# Patient Record
Sex: Male | Born: 1950 | Race: Black or African American | Hispanic: No | Marital: Married | Smoking: Current every day smoker
Health system: Southern US, Community
[De-identification: ages and names within clinical notes are randomized; demographics above are authoritative.]

## PROBLEM LIST (undated history)

## (undated) DIAGNOSIS — M549 Dorsalgia, unspecified: Secondary | ICD-10-CM

## (undated) DIAGNOSIS — G8929 Other chronic pain: Secondary | ICD-10-CM

## (undated) DIAGNOSIS — C801 Malignant (primary) neoplasm, unspecified: Secondary | ICD-10-CM

## (undated) DIAGNOSIS — G510 Bell's palsy: Secondary | ICD-10-CM

## (undated) DIAGNOSIS — J329 Chronic sinusitis, unspecified: Secondary | ICD-10-CM

## (undated) DIAGNOSIS — B029 Zoster without complications: Secondary | ICD-10-CM

## (undated) HISTORY — PX: PROSTATE SURGERY: SHX751

---

## 2007-02-14 ENCOUNTER — Encounter: Admission: RE | Admit: 2007-02-14 | Discharge: 2007-02-14 | Payer: Self-pay | Admitting: Urology

## 2007-05-04 ENCOUNTER — Inpatient Hospital Stay (HOSPITAL_COMMUNITY): Admission: RE | Admit: 2007-05-04 | Discharge: 2007-05-06 | Payer: Self-pay | Admitting: Urology

## 2007-05-04 ENCOUNTER — Encounter (INDEPENDENT_AMBULATORY_CARE_PROVIDER_SITE_OTHER): Payer: Self-pay | Admitting: Urology

## 2007-07-29 ENCOUNTER — Ambulatory Visit (HOSPITAL_BASED_OUTPATIENT_CLINIC_OR_DEPARTMENT_OTHER): Admission: RE | Admit: 2007-07-29 | Discharge: 2007-07-29 | Payer: Self-pay | Admitting: Urology

## 2010-09-30 NOTE — Op Note (Signed)
Gene Todd, Gene Todd               ACCOUNT NO.:  [id number]   MEDICAL RECORD NO.:  [id number]          PATIENT TYPE:  AMB   LOCATION:  [facility]                         FACILITY:  [facility]   PHYSICIAN:  Heloise Purpura, MD      DATE OF BIRTH:  [birth year]   DATE OF PROCEDURE:  07/29/2007  DATE OF DISCHARGE:                               OPERATIVE REPORT   PREOPERATIVE DIAGNOSIS:  Bladder stone versus foreign body.   POSTOPERATIVE DIAGNOSIS:  Bladder stone.   PROCEDURE:  1. Cystoscopy.  2. Removal of bladder stone (0.5 cm).  3. Fulguration of bladder.   SURGEON:  Heloise Purpura, MD   ANESTHESIA:  General.   COMPLICATIONS:  None.   INDICATION:  Gene Todd is a 60 year old gentleman with a history of  prostate cancer status post a robotic prostatectomy approximately 2-1/2  month ago.  He has had persistent dysuria with pyuria and hematuria  following his prostatectomy which has not improved.  He has undergone  multiple urine cultures which have all been negative, and he has not  improved despite empiric treatment with antibiotics, anti-  inflammatories, and pelvic physiotherapy.  He underwent office  cystoscopy which he did not tolerate very well, but did allow me to see  that he appeared to have calcification around the area of the bladder  neck consistent with stone material.  No definite foreign body was seen,  but it was suspected that he could potentially have a foreign body at  his anastomosis.  We discussed options for management.  He elected to  proceed with the above procedures.  Potential risks, complications, and  alternative options were discussed; and informed consent was obtained.   DESCRIPTION OF PROCEDURE:  The patient was taken to the operating room,  and a general anesthetic was administered.  He was given preoperative  antibiotics, placed in the dorsal lithotomy position, and prepped and  draped in the usual sterile fashion.   Next a preoperative time-out was  performed.  Cystourethroscopy was  performed with both a 12-and-70-degree lens which demonstrated the  ureteral orifices to be in the normal anatomic position and effluxing  clear urine.  There was no evidence of any bladder tumors, or mucosal  abnormalities within the bladder.  There was noted to be stone material  along the posterior aspect of the bladder-urethral anastomosis.  Flexible graspers were used to remove this stone material, and there was  no obvious underlying foreign body identified.  There was no stone  material located anteriorly where any stable migration would be expected  to be located.   On the right posterior aspect of the anastomosis, there did appear to be  a small pocket beneath the mucosa.  This was probed with a 6-French  ureteral catheter which appeared to demonstrate a blind-ending, and not  very deep pouch.  This is where the majority of the stone material was  located, and it was all removed.  There was noted to be a little  bit of bleeding near the bladder neck which was fulgurated until  hemostasis was achieved.  The patient's bladder was  emptied, and the  procedure was ended.  He appeared to tolerate the procedure well, and  without complications.  He was able to be awakened, and transferred to  the recovery unit in satisfactory condition.      Heloise Purpura, MD  Electronically Signed     LB/MEDQ  D:  07/29/2007  T:  07/30/2007  Job:  [id number]

## 2010-09-30 NOTE — Op Note (Signed)
Gene Todd, Gene Todd               ACCOUNT NO.:  [id number]   MEDICAL RECORD NO.:  [id number]          PATIENT TYPE:  INP   LOCATION:  0009                         FACILITY:  [facility]   PHYSICIAN:  Heloise Purpura, MD      DATE OF BIRTH:  22-May-1950   DATE OF PROCEDURE:  05/04/2007  DATE OF DISCHARGE:                               OPERATIVE REPORT   PREOPERATIVE DIAGNOSIS:  Clinically localized adenocarcinoma of  prostate.   POSTOPERATIVE DIAGNOSIS:  Clinically localized adenocarcinoma of  prostate.   PROCEDURES.:  1. Robotic assisted laparoscopic radical prostatectomy (non nerve      sparing).  2. Bilateral laparoscopic pelvic lymphadenectomy.   SURGEON:  Heloise Purpura, MD   ASSISTANT:  Dr. Rachel Bo   ANESTHESIA:  General.   COMPLICATIONS:  None.   ESTIMATED BLOOD LOSS:  100 mL.   SPECIMENS:  1. Prostate seminal vesicles.  2. Right pelvic lymph nodes.  3. Left pelvic lymph nodes.   DISPOSITION OF SPECIMENS:  To pathology.   DRAINS:  1. 20-French coude' catheter.  2. #19 Blake pelvic drain.   INDICATIONS:  Gene Todd is a 60 year old gentleman with clinically  localized adenocarcinoma of prostate.  After undergoing a thorough  preoperative discussion regarding management options for treatment of  his clinically localized prostate cancer, he elected to proceed with  surgical therapy and the above procedures.  Potential risks,  complications and alternative options were discussed in detail with the  patient and informed consent was obtained.   DESCRIPTION OF PROCEDURE:  The patient was taken to the operating room  and a general anesthetic was administered.  He was given preoperative  antibiotics, placed in the dorsal lithotomy position, and prepped and  draped in the usual sterile fashion.  Next a preoperative time-out was  performed.  A Foley catheter was inserted into the bladder.  A site was  then selected just superior to the umbilicus for placement of the camera  port.  This was placed using a standard open Hasson technique.  This  allowed entry into the peritoneal cavity under direct vision without  difficulty.  A 12 mm port was then placed and a pneumoperitoneum  established.  The 0 degrees lens was used to inspect the abdomen and  there was no evidence of any intra-abdominal injuries or other  abnormalities.  The remaining ports then placed.  Bilateral 8 mm robotic  ports were placed approximately 10 cm lateral to and just inferior to  the camera port site.  Additional 8 mm robotic port was placed in the  far left lateral abdominal wall.  A 5 mm port was placed between the  camera port and the right robotic port.  An additional 12 mm port was  placed in the far right lateral abdominal wall for laparoscopic  assistance.  All ports placed under direct vision without difficulty.  The surgical cart was then docked.  With the aid of the cautery  scissors, the bladder was reflected posteriorly allowing entry in the  space of Retzius and identification of the endopelvic fascia and  prostate.  The endopelvic fascia  was then incised from the apex back to  the base of the prostate bilaterally and the underlying levator muscle  fibers were swept laterally off the prostate.  The dorsal venous complex  was therefore isolated and was then stapled and divided with a 45 mm  flex ETS stapler.  The bladder neck was then identified with the aid of  full of Foley catheter manipulation and divided anteriorly.  This  exposed the Foley catheter and the catheter balloon was deflated.  The  catheter was then brought into the operative field and used to retract  the prostate anteriorly which exposed the posterior bladder neck.  The  bladder was then divided posteriorly and dissection continued between  the bladder and prostate until the vasa deferentia and seminal vesicles  were identified.  The vasa deferentia were isolated, divided and lifted  anteriorly.  The seminal  vesicles were then dissected down to their tips  with care to control the seminal vesicle arterial blood supply.  Seminal  vesicles were then lifted anteriorly and the space between Denonvilliers  fascia and the anterior rectum was bluntly developed thereby isolating  the vascular pedicles of the prostate.  The vascular pedicles of  prostate then ligated with Hem-o-lok clips in a wide non nerve sparing  fashion.  The urethra was then sharply divided allowing the prostate  specimen to be disarticulated.  The pelvis was then copiously irrigated  and with irrigation in the pelvis, air was injected into the rectal  catheter and there was no evidence of a rectal injury.  Attention then  turned to the right pelvic sidewall and the fibrofatty tissue between  the external iliac vein, confluence of the iliac vessels, hypogastric  artery, and Cooper's ligament was dissected free from the pelvic  sidewall with care to preserve the obturator nerve.  Hem-o-lok clips  were used for lymphostasis and hemostasis.  This specimen was then  passed off for permanent pathologic analysis.  An identical procedure  was then performed on the contralateral side.  Attention then turned the  urethral anastomosis.  A 2-0 Vicryl suture was placed as a slip knot  between the posterior urethra, Denonvilliers fascia and the posterior  bladder neck to reapproximate these structures.  A double-armed 3-0  Monocryl suture was then used perform a 360 degrees running tension-free  anastomosis between the bladder neck and urethra.  A new 20-French  coude' catheter was inserted into the bladder and irrigated.  There no  blood clots within the bladder and anastomosis appeared be watertight.  A #19 Blake drain was then brought through the left robotic port and  appropriately position in the pelvis.  It was secured to skin with nylon  suture.  The surgical cart was then undocked and the right lateral 12 mm  port site was closed with  a 0 Vicryl suture placed with the aid of  suture passer device.  All remaining ports removed under direct vision  and the prostate specimen was removed intact via the periumbilical port  site.  This fascial opening was then closed with a running 0 Vicryl  suture.  All port sites were injected with 0.25% Marcaine and  reapproximated skin level with staples.  Sterile dressings were applied.  The patient appeared tolerate procedure well without complications.  He  was able to be extubated and transferred to recovery unit in  satisfactory condition.      Heloise Purpura, MD  Electronically Signed     LB/MEDQ  D:  05/04/2007  T:  05/05/2007  Job:  [id number]

## 2010-09-30 NOTE — Discharge Summary (Signed)
NAMECARTER, Gene Todd               ACCOUNT NO.:  [id number]   MEDICAL RECORD NO.:  [id number]          PATIENT TYPE:  INP   LOCATION:  1420                         FACILITY:  [facility]   PHYSICIAN:  Heloise Purpura, MD      DATE OF BIRTH:  [birth year]   DATE OF ADMISSION:  05/04/2007  DATE OF DISCHARGE:  05/06/2007                               DISCHARGE SUMMARY   ADMISSION DIAGNOSIS:  Prostate cancer.   DISCHARGE DIAGNOSIS:  Prostate cancer.   HISTORY AND PHYSICAL:  For full details please see admission history and  physical.   Briefly, the patient is a 60 year old gentleman with clinically  localized adenocarcinoma of the prostate.  After discussing management  options for treatment, he elected to proceed with surgical therapy and a  robotic radical prostatectomy.   HOSPITAL COURSE:  On May 04, 2007 the patient was taken to the  operating room and underwent a robotic assisted laparoscopic radical  prostatectomy and bilateral pelvic lymphadenectomy.  He tolerated this  procedure well without complications.  Postoperatively he was able to be  transferred to a regular hospital room following recovery from  anesthesia.  He was able to begin ambulating the night of surgery.   On postoperative day #1 he was found to be hemodynamically stable and  his hematocrit remained stable at 40.0.  He maintained excellent urine  output with minimal output from his pelvic drain and his pelvic drain  was removed.  He continued ambulating was begun on a clear liquid diet.  On the afternoon of postoperative day #1 he did have significant  abdominal pain and distention.  This was managed with suppositories and  the patient subsequently passed flatus that evening.  By the morning of  postoperative day #2 he was much improved and was in excellent condition  for discharge home.   DISPOSITION:  Home.   DISCHARGE MEDICATIONS:  The patient was instructed to resume his regular  home medications, except  for any aspirin, nonsteroidal anti-inflammatory  drugs or herbal supplements.  He was given a prescription to take  Vicodin as needed for pain and to use Colace as stool softener.  He was  also given a prescription to begin Cipro 1 day prior to his return visit  for Foley catheter removal.   DISCHARGE INSTRUCTIONS:  The patient was instructed to be ambulatory,  but specifically told to refrain from any heavy lifting, strenuous  activity or driving.  He was given instructions on routine Foley  catheter care and told to gradually advance his diet over the course of  the next few days.   FOLLOW UP:  He will follow up in 1 week for removal of his Foley  catheter and to discuss his surgical pathology in detail.      Heloise Purpura, MD  Electronically Signed     LB/MEDQ  D:  05/06/2007  T:  05/08/2007  Job:  [id number]

## 2010-09-30 NOTE — H&P (Signed)
NAMEHERMENEGILDO, Gene Todd               ACCOUNT NO.:  [id number]   MEDICAL RECORD NO.:  [id number]          PATIENT TYPE:  INP   LOCATION:  NA                           [facility]ACILITY:  [facility]   PHYSICIAN:  Heloise Purpura, MD      DATE OF BIRTH:  [birth year]   DATE OF ADMISSION:  DATE OF DISCHARGE:                              HISTORY & PHYSICAL   CHIEF COMPLAINT:  Prostate cancer.   HISTORY:  Gene Todd is a 60 year old gentleman with clinical stage T1C  prostate cancer with a PSA of 15 and Gleason score 3 +4 equals 7.  Staging studies were negative for metastatic disease.  After discussing  options for treatment management, he elected to proceed with surgical  therapy and a robotic prostatectomy.   PAST MEDICAL HISTORY:  1. Gastroesophageal reflux disease.  2. Hyperlipidemia.   PAST SURGICAL HISTORY:  None.   MEDICATIONS:  None.   ALLERGIES:  No known drug allergies.   FAMILY HISTORY:  There is no family history of prostate cancer.   SOCIAL HISTORY:  The patient denies alcohol or current tobacco use.   REVIEW OF SYSTEMS:  A complete Review of Systems was performed.  Pertinent positives include a recent history of heartburn, tea,  pruritus, sinus problems, anxiety and depression.  All other systems are  reviewed and are otherwise negative.   PHYSICAL EXAMINATION:  VITAL SIGNS:  Afebrile with stable vital signs.  CONSTITUTIONAL:  Alert and oriented, in no acute distress.  CARDIOVASCULAR:  Regular rate and rhythm without obvious murmurs.  LUNGS:  Clear bilaterally.  ABDOMEN:  Soft, nontender, nondistended without abdominal masses.  RECTAL:  Prostate does not demonstrate any obvious nodularity or  induration.   IMPRESSION:  Clinically localized adenocarcinoma of the prostate.   PLAN:  Gene Todd will undergo a robotic-assisted laparoscopic radical  prostatectomy and bilateral pelvic lymphadenectomy.  He will then be  admitted to the hospital for routine postoperative care.      Heloise Purpura, MD  Electronically Signed     LB/MEDQ  D:  05/04/2007  T:  05/04/2007  Job:  [id number]

## 2011-02-09 LAB — POCT HEMOGLOBIN-HEMACUE
Hemoglobin: 16.2
Operator id: 133231

## 2011-02-20 LAB — HEMOGLOBIN AND HEMATOCRIT, BLOOD
Hemoglobin: 14
Hemoglobin: 14.2

## 2011-02-20 LAB — CBC
Hemoglobin: 15.6
MCV: 88.1
Platelets: 188
RBC: 5.13
RDW: 13.9

## 2011-02-20 LAB — TYPE AND SCREEN
ABO/RH(D): B POS
Antibody Screen: NEGATIVE

## 2011-02-20 LAB — BASIC METABOLIC PANEL
BUN: 13
CO2: 28
GFR calc Af Amer: >60
GFR calc non Af Amer: >60
Potassium: 4

## 2013-06-02 ENCOUNTER — Emergency Department (HOSPITAL_COMMUNITY)
Admission: EM | Admit: 2013-06-02 | Discharge: 2013-06-02 | Disposition: A | Discharge status: Home / Self Care | Payer: Self-pay | Attending: Emergency Medicine | Admitting: Emergency Medicine

## 2013-06-02 ENCOUNTER — Encounter (HOSPITAL_COMMUNITY): Payer: Self-pay | Admitting: Emergency Medicine

## 2013-06-02 DIAGNOSIS — M5430 Sciatica, unspecified side: Secondary | ICD-10-CM | POA: Insufficient documentation

## 2013-06-02 DIAGNOSIS — Z8669 Personal history of other diseases of the nervous system and sense organs: Secondary | ICD-10-CM | POA: Insufficient documentation

## 2013-06-02 DIAGNOSIS — M5432 Sciatica, left side: Secondary | ICD-10-CM

## 2013-06-02 DIAGNOSIS — Z8546 Personal history of malignant neoplasm of prostate: Secondary | ICD-10-CM | POA: Insufficient documentation

## 2013-06-02 DIAGNOSIS — Z8709 Personal history of other diseases of the respiratory system: Secondary | ICD-10-CM | POA: Insufficient documentation

## 2013-06-02 DIAGNOSIS — Z8619 Personal history of other infectious and parasitic diseases: Secondary | ICD-10-CM | POA: Insufficient documentation

## 2013-06-02 DIAGNOSIS — F17200 Nicotine dependence, unspecified, uncomplicated: Secondary | ICD-10-CM | POA: Insufficient documentation

## 2013-06-02 DIAGNOSIS — G8929 Other chronic pain: Secondary | ICD-10-CM | POA: Insufficient documentation

## 2013-06-02 HISTORY — DX: Zoster without complications: B02.9

## 2013-06-02 HISTORY — DX: Other chronic pain: G89.29

## 2013-06-02 HISTORY — DX: Malignant (primary) neoplasm, unspecified: C80.1

## 2013-06-02 HISTORY — DX: Dorsalgia, unspecified: M54.9

## 2013-06-02 HISTORY — DX: Bell's palsy: G51.0

## 2013-06-02 HISTORY — DX: Chronic sinusitis, unspecified: J32.9

## 2013-06-02 MED ORDER — CYCLOBENZAPRINE HCL 10 MG PO TABS: 10.0000 mg | ORAL_TABLET | Freq: Three times a day (TID) | ORAL | Status: AC | PRN

## 2013-06-02 MED ORDER — MELOXICAM 7.5 MG PO TABS: 7.5000 mg | ORAL_TABLET | Freq: Every day | ORAL | Status: AC

## 2013-06-02 NOTE — ED Notes (Signed)
Pain lt hip for 5 years intermittently and worse last 3 weeks. Pain from lt hip to knee with burning pain down lower leg.

## 2013-06-02 NOTE — Discharge Instructions (Signed)
It is important that you try and get established with a primary care doctor to follow up on your health issues.

## 2013-06-02 NOTE — ED Provider Notes (Signed)
CSN: [id number]     Arrival date & time 06/02/13  1314 History   First MD Initiated Contact with Patient 06/02/13 1408     Chief Complaint  Patient presents with  . Hip Pain   (Consider location/radiation/quality/duration/timing/severity/associated sxs/prior Treatment) Patient is a 63 y.o. male presenting with hip pain. The history is provided by the patient.  Hip Pain This is a chronic problem. The current episode started more than 1 year ago. The problem occurs constantly. The problem has been gradually worsening.   Gene Todd is a 63 y.o. male who presents to the ED with chronic left hip pain that has been going on for over a year. He does not have a PCP due to lack of insurance. He has a history of low back pain has has been off and on for several years. He has a history of prostate cancer. He has not had any follow up in about 2 years because he has no insurance. He has applied for disability and is awaiting a reply. Today he request pain management for his chronic pain. The pain starts in the left buttock and radiates to the back of the left knee. He denies loss of control of bladder or bowels.   Past Medical History  Diagnosis Date  . Shingles   . Bell's palsy   . Sinus infection   . Cancer   . Chronic back pain    Past Surgical History  Procedure Laterality Date  . Prostate surgery     No family history on file. History  Substance Use Topics  . Smoking status: Current Every Day Smoker    Types: Cigarettes  . Smokeless tobacco: Not on file  . Alcohol Use: Yes     Comment: occ    Review of Systems Negative except as stated in HPI  Allergies  Apple  Home Medications   Current Outpatient Rx  Name  Route  Sig  Dispense  Refill  . ibuprofen (ADVIL,MOTRIN) 200 MG tablet   Oral   Take 800 mg by mouth every 6 (six) hours as needed for moderate pain.          BP 149/95  Pulse 98  Temp(Src) 98.1 F (36.7 C) (Oral)  Resp 20  Ht 5' 10.5" (1.791 m)  Wt 200 lb  (90.719 kg)  BMI 28.28 kg/m2  SpO2 98% Physical Exam  Nursing note and vitals reviewed. Constitutional: He is oriented to person, place, and time. He appears well-developed and well-nourished. No distress.  HENT:  Head: Normocephalic and atraumatic.  Eyes: EOM are normal.  Neck: Neck supple.  Cardiovascular: Normal rate and regular rhythm.   Pulmonary/Chest: Effort normal and breath sounds normal.  Abdominal: Soft. Bowel sounds are normal. There is no tenderness.  Musculoskeletal:       Lumbar back: He exhibits tenderness and spasm. He exhibits normal range of motion and normal pulse.       Back:  Tender with palpation over sciatic nerve. Pedal pulses equal, adequate circulation, good touch sensation and good strength bilateral. Pain with straight leg raises on the left. Steady gait without foot drag.   Neurological: He is alert and oriented to person, place, and time. He has normal strength and normal reflexes. No cranial nerve deficit or sensory deficit. Gait normal.  Skin: Skin is warm and dry.  Psychiatric: He has a normal mood and affect. His behavior is normal.    ED Course  Procedures  MDM  63 y.o. male with left  sciatic pain. And chronic low back pain. Will treat with NSAID'S and muscle relaxants.  Discussed with the patient and all questioned fully answered. He will return if symptoms worsen. Stable for discharge without neuro deficits. Ambulatory without difficulty. States pain is better with ambulation.    Medication List    TAKE these medications       cyclobenzaprine 10 MG tablet  Commonly known as:  FLEXERIL  Take 1 tablet (10 mg total) by mouth 3 (three) times daily as needed for muscle spasms.     meloxicam 7.5 MG tablet  Commonly known as:  MOBIC  Take 1 tablet (7.5 mg total) by mouth daily.      ASK your doctor about these medications       ibuprofen 200 MG tablet  Commonly known as:  ADVIL,MOTRIN  Take 800 mg by mouth every 6 (six) hours as needed for  moderate pain.          [place], [place] 06/02/13 [phone]

## 2013-06-02 NOTE — ED Notes (Signed)
Pt reports left hip pain for the last year, denies any known injury, has not been to a doctor d/t lack in insurance.

## 2013-06-03 NOTE — ED Provider Notes (Signed)
Medical screening examination/treatment/procedure(s) were performed by non-physician practitioner and as supervising physician I was immediately available for consultation/collaboration.  EKG Interpretation   None        Merryl Hacker, MD 06/03/13 [phone]

## 2013-10-10 ENCOUNTER — Ambulatory Visit: Payer: Self-pay

## 2014-02-15 DEATH — deceased

## 2015-01-22 IMAGING — CR DG HIP COMPLETE 2+V*L*
1 series · 3 of 3 positions shown · non-contrast
Comparison: 10/10/2013

CLINICAL DATA: Left sciatic pain

EXAM:
LEFT HIP - COMPLETE 2+ VIEW

[Series 1: t pelvis ap · 0.14mm/px · 3 of 3 slices shown]
[im 1/3]
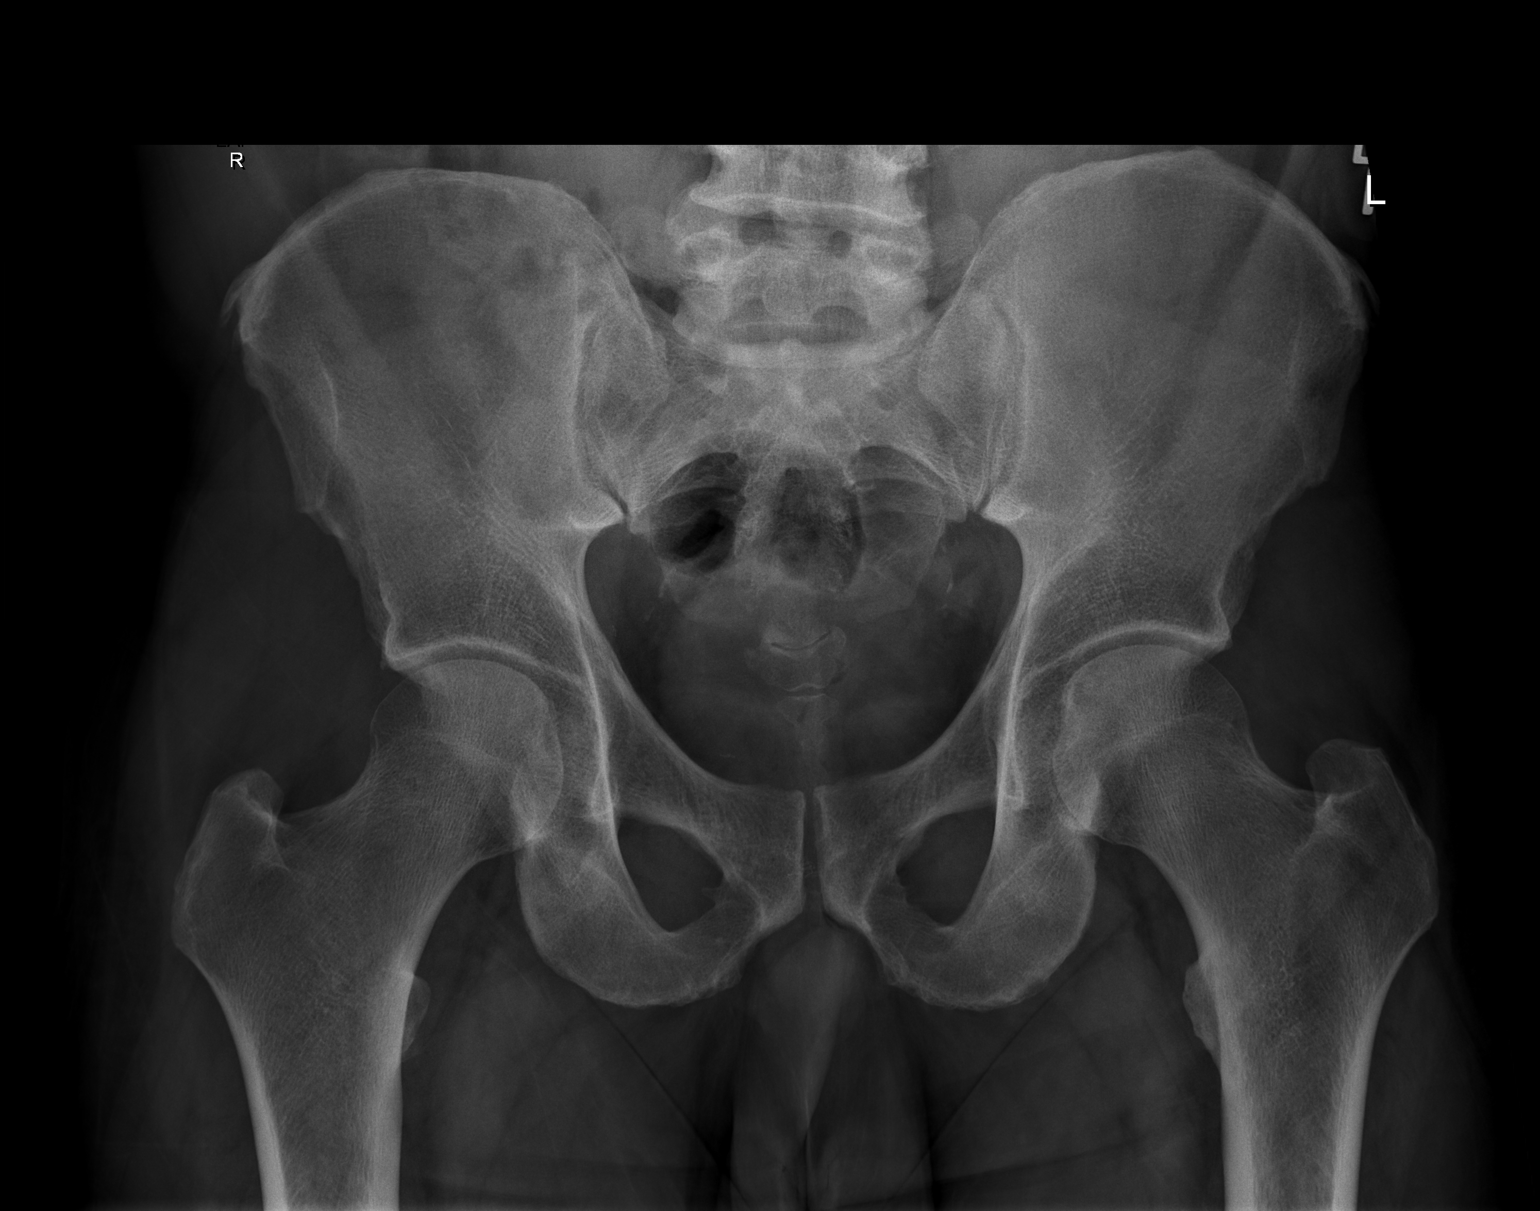
[im 2/3]
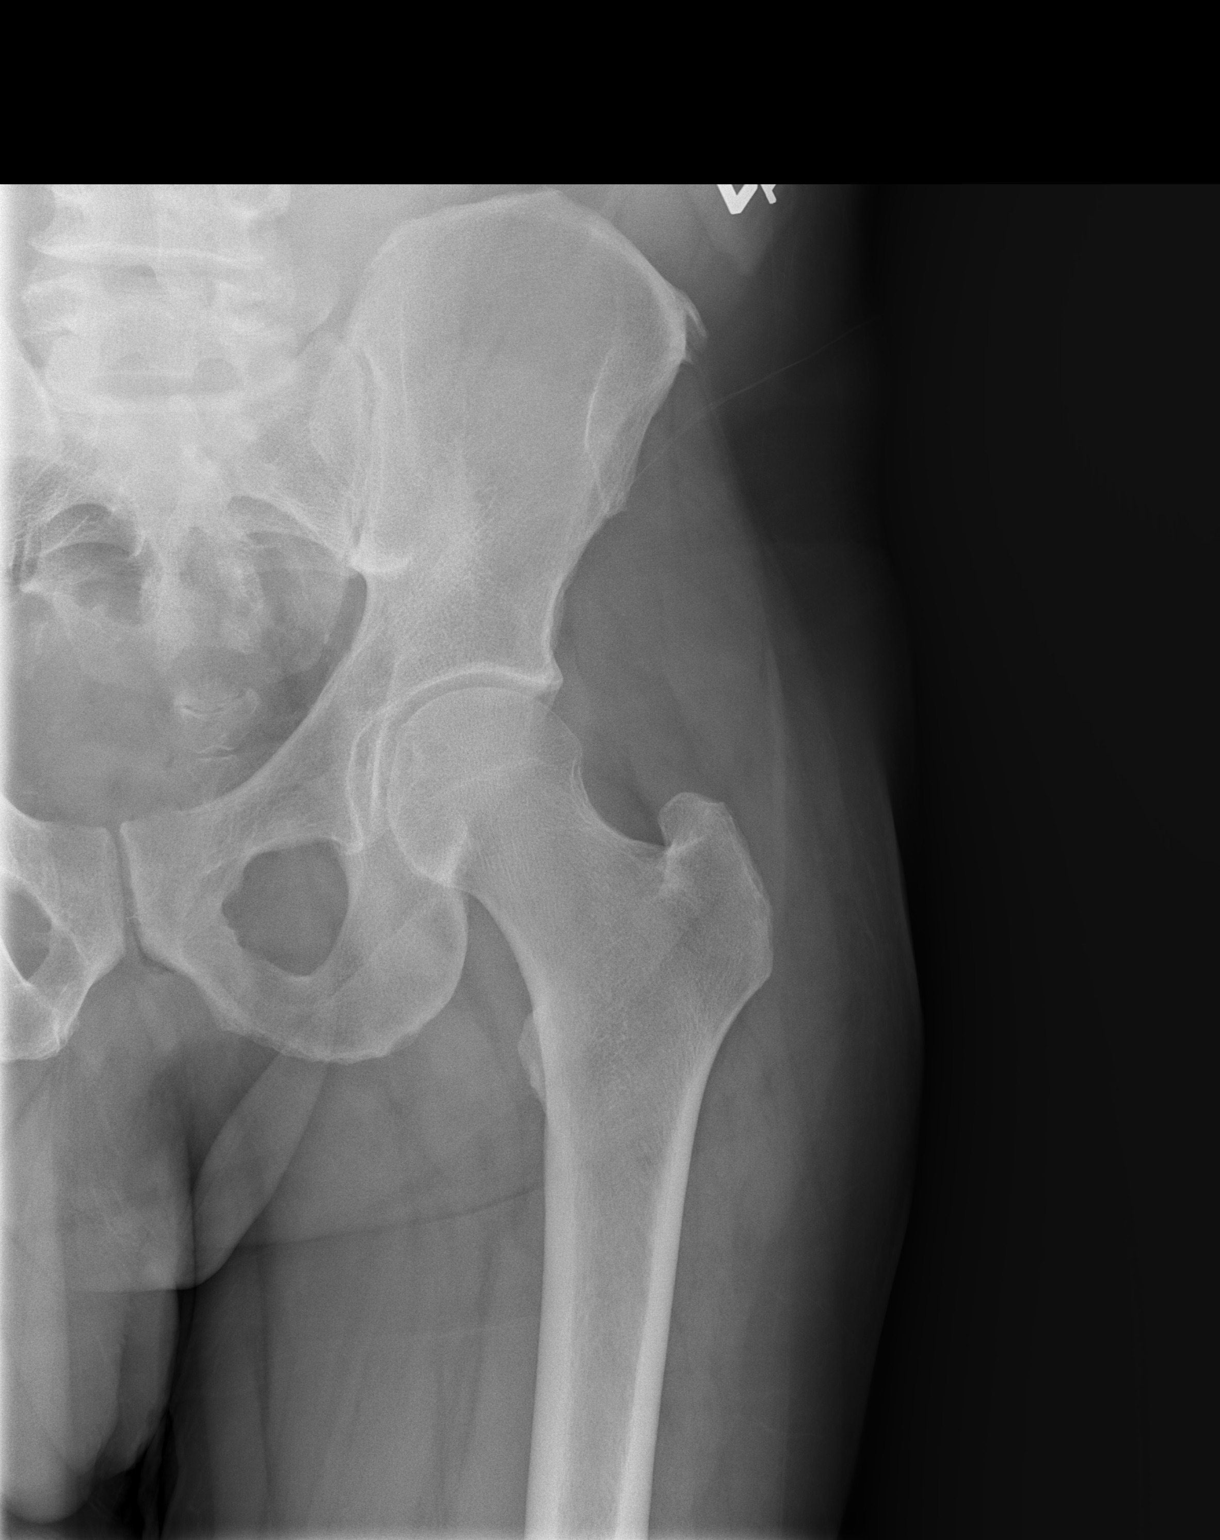
[im 3/3]
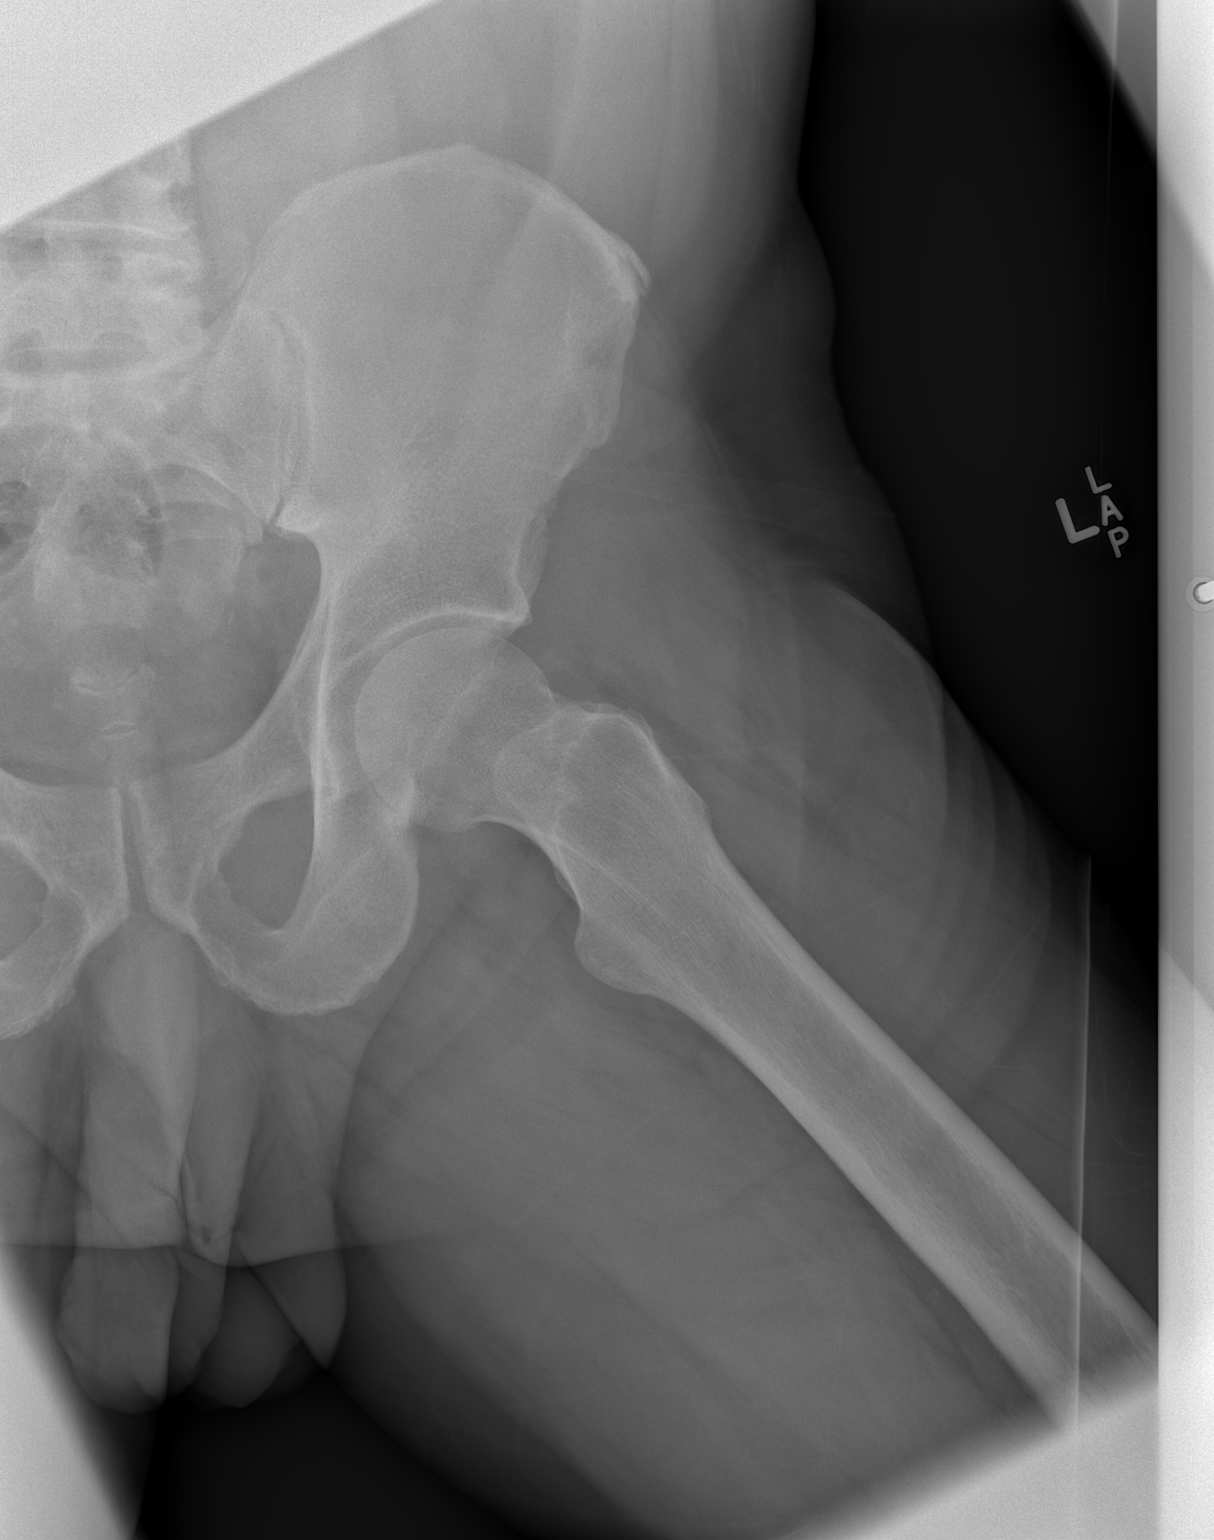

[3 of 3 positions shown; findings below may reference images not displayed]

FINDINGS: There is no evidence of hip fracture or dislocation. There is no
evidence of arthropathy or other focal bone abnormality.
IMPRESSION: No acute osseous finding
